# Patient Record
Sex: Male | Born: 1965 | Race: White | Hispanic: No | Marital: Single | Smoking: Current some day smoker
Health system: Southern US, Community
[De-identification: ages and names within clinical notes are randomized; demographics above are authoritative.]

## PROBLEM LIST (undated history)

## (undated) DIAGNOSIS — I10 Essential (primary) hypertension: Secondary | ICD-10-CM

## (undated) DIAGNOSIS — K2970 Gastritis, unspecified, without bleeding: Secondary | ICD-10-CM

## (undated) DIAGNOSIS — K859 Acute pancreatitis without necrosis or infection, unspecified: Secondary | ICD-10-CM

## (undated) DIAGNOSIS — B1920 Unspecified viral hepatitis C without hepatic coma: Secondary | ICD-10-CM

## (undated) HISTORY — PX: ANKLE SURGERY: SHX546

---

## 2016-09-14 DIAGNOSIS — D492 Neoplasm of unspecified behavior of bone, soft tissue, and skin: Secondary | ICD-10-CM | POA: Insufficient documentation

## 2016-09-14 DIAGNOSIS — L98492 Non-pressure chronic ulcer of skin of other sites with fat layer exposed: Secondary | ICD-10-CM | POA: Insufficient documentation

## 2016-12-08 DIAGNOSIS — F1210 Cannabis abuse, uncomplicated: Secondary | ICD-10-CM | POA: Insufficient documentation

## 2016-12-08 DIAGNOSIS — F1410 Cocaine abuse, uncomplicated: Secondary | ICD-10-CM | POA: Insufficient documentation

## 2017-07-03 DIAGNOSIS — K2920 Alcoholic gastritis without bleeding: Secondary | ICD-10-CM | POA: Insufficient documentation

## 2017-07-03 DIAGNOSIS — F602 Antisocial personality disorder: Secondary | ICD-10-CM | POA: Insufficient documentation

## 2017-07-03 DIAGNOSIS — I10 Essential (primary) hypertension: Secondary | ICD-10-CM | POA: Insufficient documentation

## 2017-10-18 DIAGNOSIS — F1910 Other psychoactive substance abuse, uncomplicated: Secondary | ICD-10-CM | POA: Insufficient documentation

## 2017-10-18 DIAGNOSIS — H7092 Unspecified mastoiditis, left ear: Secondary | ICD-10-CM | POA: Insufficient documentation

## 2018-10-18 DIAGNOSIS — F1020 Alcohol dependence, uncomplicated: Secondary | ICD-10-CM | POA: Insufficient documentation

## 2018-10-18 DIAGNOSIS — Z765 Malingerer [conscious simulation]: Secondary | ICD-10-CM | POA: Insufficient documentation

## 2018-11-11 ENCOUNTER — Emergency Department (HOSPITAL_COMMUNITY)
Admission: EM | Admit: 2018-11-11 | Discharge: 2018-11-11 | Disposition: A | Discharge status: Home / Self Care | Payer: Medicaid Other | Attending: Emergency Medicine | Admitting: Emergency Medicine

## 2018-11-11 ENCOUNTER — Encounter (HOSPITAL_COMMUNITY): Payer: Self-pay | Admitting: *Deleted

## 2018-11-11 ENCOUNTER — Other Ambulatory Visit: Payer: Self-pay

## 2018-11-11 ENCOUNTER — Emergency Department (HOSPITAL_COMMUNITY): Payer: Medicaid Other

## 2018-11-11 DIAGNOSIS — H9202 Otalgia, left ear: Secondary | ICD-10-CM | POA: Diagnosis present

## 2018-11-11 DIAGNOSIS — I10 Essential (primary) hypertension: Secondary | ICD-10-CM | POA: Diagnosis not present

## 2018-11-11 DIAGNOSIS — F17200 Nicotine dependence, unspecified, uncomplicated: Secondary | ICD-10-CM | POA: Diagnosis not present

## 2018-11-11 DIAGNOSIS — S0190XA Unspecified open wound of unspecified part of head, initial encounter: Secondary | ICD-10-CM

## 2018-11-11 HISTORY — DX: Essential (primary) hypertension: I10

## 2018-11-11 HISTORY — DX: Unspecified viral hepatitis C without hepatic coma: B19.20

## 2018-11-11 HISTORY — DX: Gastritis, unspecified, without bleeding: K29.70

## 2018-11-11 HISTORY — DX: Acute pancreatitis without necrosis or infection, unspecified: K85.90

## 2018-11-11 LAB — CBC WITH DIFFERENTIAL/PLATELET
Abs Immature Granulocytes: 0 10*3/uL (ref 0.00–0.07)
Basophils Absolute: 0 10*3/uL (ref 0.0–0.1)
Basophils Relative: 0 %
Eosinophils Absolute: 0.2 10*3/uL (ref 0.0–0.5)
Eosinophils Relative: 3 %
HCT: 37.3 % — ABNORMAL LOW (ref 39.0–52.0)
Hemoglobin: 12.9 g/dL — ABNORMAL LOW (ref 13.0–17.0)
Lymphocytes Relative: 51 %
Lymphs Abs: 2.9 10*3/uL (ref 0.7–4.0)
MCH: 34.8 pg — ABNORMAL HIGH (ref 26.0–34.0)
MCHC: 34.6 g/dL (ref 30.0–36.0)
MCV: 100.5 fL — ABNORMAL HIGH (ref 80.0–100.0)
Monocytes Absolute: 0.4 10*3/uL (ref 0.1–1.0)
Monocytes Relative: 7 %
Neutro Abs: 2.2 10*3/uL (ref 1.7–7.7)
Neutrophils Relative %: 39 %
Platelets: 146 10*3/uL — ABNORMAL LOW (ref 150–400)
RBC: 3.71 MIL/uL — ABNORMAL LOW (ref 4.22–5.81)
RDW: 12 % (ref 11.5–15.5)
WBC: 5.6 10*3/uL (ref 4.0–10.5)
nRBC: 0 % (ref 0.0–0.2)
nRBC: 0 /100 WBC

## 2018-11-11 LAB — BASIC METABOLIC PANEL
Anion gap: 12 (ref 5–15)
BUN: <5 mg/dL — ABNORMAL LOW (ref 6–20)
CO2: 25 mmol/L (ref 22–32)
Calcium: 8.9 mg/dL (ref 8.9–10.3)
Chloride: 99 mmol/L (ref 98–111)
Creatinine, Ser: 0.85 mg/dL (ref 0.61–1.24)
GFR calc Af Amer: >60 mL/min (ref >60)
GFR calc non Af Amer: >60 mL/min (ref >60)
Glucose, Bld: 120 mg/dL — ABNORMAL HIGH (ref 70–99)
Potassium: 3.4 mmol/L — ABNORMAL LOW (ref 3.5–5.1)
Sodium: 136 mmol/L (ref 135–145)

## 2018-11-11 MED ORDER — IBUPROFEN 400 MG PO TABS
400.0000 mg | ORAL_TABLET | Freq: Once | ORAL | Status: AC | PRN
  Administered 2018-11-11: 400 mg via ORAL
  Filled 2018-11-11: qty 1

## 2018-11-11 MED ORDER — IOHEXOL 300 MG/ML  SOLN
100.0000 mL | Freq: Once | INTRAMUSCULAR | Status: AC
  Administered 2018-11-11: 100 mL via INTRAVENOUS

## 2018-11-11 MED ORDER — DOXYCYCLINE HYCLATE 100 MG PO CAPS: 100.0000 mg | ORAL_CAPSULE | Freq: Two times a day (BID) | ORAL | 0 refills | Status: AC

## 2018-11-11 NOTE — ED Notes (Signed)
ED Provider at bedside. 

## 2018-11-11 NOTE — ED Notes (Signed)
Pt upset that he has been waiting and not seen a provider. Pt has been thus far calm and quiet in his room. He has become belligerent, yelling and cursing at staff. Out in hallways, yelling Security to bedside, provider at bedside for assessment.

## 2018-11-11 NOTE — ED Provider Notes (Signed)
[facility] Provider Note   CSN: [id number] Arrival date & time: 11/11/18  0310     History   Chief Complaint Chief Complaint  Patient presents with  . Alcohol Intoxication  . Wound Check    HPI Bryan Liu is a 53 y.o. male.  HPI Patient presents to the emergency department with a chronic wound around his left ear.  Patient is very combative and argumentative in the room.  He is not a very good historian.  He states that these areas around his left ear have been open for quite a while.  The patient states that been draining and feels like there is more blood coming from the area recently.  Patient states that he has been seen at [facility].  He states this started after having an area opened via his ear by an ENT doctor.  He states that was 8 years ago.  The patient states that he is not been on any recent antibiotics.  The patient denies chest pain, shortness of breath, headache,blurred vision, neck pain, fever, cough, weakness, numbness, dizziness, anorexia, edema, abdominal pain, nausea, vomiting, diarrhea, rash, back pain, dysuria, hematemesis, bloody stool, near syncope, or syncope. Past Medical History:  Diagnosis Date  . Gastritis   . Hepatitis C   . Hypertension   . Pancreatitis     Patient Active Problem List   Diagnosis Date Noted  . Malingerer 10/18/2018  . Uncomplicated alcohol dependence ([place]) 10/18/2018  . 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 Polysubstance abuse ([place]) 10/18/2017  . Acute alcoholic gastritis without hemorrhage 07/03/2017  . Antisocial personality disorder ([place]) 07/03/2017  . Essential hypertension 07/03/2017  . Cocaine abuse ([place]) 12/08/2016  . Marijuana abuse 12/08/2016  . Skin neoplasm 09/14/2016  . Skin ulcer with fat layer exposed ([place]) 09/14/2016    Past Surgical History:  Procedure Laterality Date  . ANKLE SURGERY          Home Medications    Prior to  Admission medications   Not on File    Family History No family history on file.  Social History Social History   Tobacco Use  . Smoking status: Current Some Day Smoker  Substance Use Topics  . Alcohol use: Yes  . Drug use: Yes    Types: Marijuana     Allergies   Bee venom   Review of Systems Review of Systems All other systems negative except as documented in the HPI. All pertinent positives and negatives as reviewed in the HPI.  Physical Exam Updated Vital Signs BP 114/75 (BP Location: Right Arm)   Pulse 84   Temp 97.8 F (36.6 C) (Oral)   Resp 14   Ht 5\' 9"  (1.753 m)   Wt 102.1 kg   SpO2 94%   BMI 33.23 kg/m   Physical Exam Vitals signs and nursing note reviewed.  Constitutional:      General: He is not in acute distress.    Appearance: He is well-developed.  HENT:     Head: Normocephalic and atraumatic.     Ears:   Eyes:     Pupils: Pupils are equal, round, and reactive to light.  Neck:     Musculoskeletal: Normal range of motion and neck supple.  Cardiovascular:     Rate and Rhythm: Normal rate and regular rhythm.     Heart sounds: Normal heart sounds. No murmur. No friction rub. No gallop.   Pulmonary:     Effort:  Pulmonary effort is normal. No respiratory distress.     Breath sounds: Normal breath sounds. No wheezing.  Skin:    General: Skin is warm and dry.     Capillary Refill: Capillary refill takes less than 2 seconds.     Findings: No erythema or rash.  Neurological:     Mental Status: He is alert and oriented to person, place, and time.     Motor: No abnormal muscle tone.     Coordination: Coordination normal.  Psychiatric:        Behavior: Behavior normal.      ED Treatments / Results  Labs (all labs ordered are listed, but only abnormal results are displayed) Labs Reviewed  BASIC METABOLIC PANEL - Abnormal; Notable for the following components:      Result Value   Potassium 3.4 (*)    Glucose, Bld 120 (*)    BUN <5 (*)      All other components within normal limits  CBC WITH DIFFERENTIAL/PLATELET - Abnormal; Notable for the following components:   RBC 3.71 (*)    Hemoglobin 12.9 (*)    HCT 37.3 (*)    MCV 100.5 (*)    MCH 34.8 (*)    Platelets 146 (*)    All other components within normal limits    EKG None  Radiology Ct Soft Tissue Neck W Contrast  Result Date: 11/11/2018 CLINICAL DATA:  Swelling and drainage in the left ear. Soft tissue wound. EXAM: CT NECK WITH CONTRAST TECHNIQUE: Multidetector CT imaging of the neck was performed using the standard protocol following the bolus administration of intravenous contrast. CONTRAST:  100 cc Omnipaque 300 COMPARISON:  Temporal bone same day FINDINGS: Pharynx and larynx: No mucosal or submucosal lesion. Salivary glands: Right parotid gland is normal. Both submandibular glands are normal. Abnormal appearance of the left parotid gland which shows abnormal tissue extending from the left temporal bone and auricle regions. This is nonspecific for inflammatory tissue versus tumor. Thyroid: Normal Lymph nodes: No enlarged or low-density lymph nodes on either side of the neck. Normal nodal station nodes. Vascular: No sign of venous thrombosis. Some extrinsic compression of the upper internal jugular vein on the left. Atherosclerotic calcification at both carotid bifurcations. Limited intracranial: Normal Visualized orbits: Normal Mastoids and visualized paranasal sinuses: Paranasal sinuses are clear. Right middle ear and mastoid region are normal. On the left, there is advanced mastoid pathology with bone destruction. See results of temporal bone study for detailed mastoid and middle ear findings. Pronounced abnormal soft tissue in the region of the auricle and regional soft tissues on the left, including extension into the region of the parotid gland, involvement of the upper sternocleidomastoid muscle and other upper neck posterolateral musculature. As noted above, this could  be inflammatory tissue, but the possibility of tumor is not excluded at this point. Skeleton: Ordinary cervical spondylosis. Skull base otherwise negative. See results of temporal bone scan. Upper chest: Negative Other: None IMPRESSION: Destructive process of the left temporal bone. Abnormal appearance of the auricle and soft tissues around the ear and in the upper left neck with the differential diagnosis being that of inflammatory disease versus tumor. No abnormality lower in the neck. See results of temporal bone CT. Electronically Signed   By: Nelson Chimes M.D.   On: 11/11/2018 11:07   Ct Temporal Bones W Contrast  Result Date: 11/11/2018 CLINICAL DATA:  Draining wound in the region of the left ear. EXAM: CT TEMPORAL BONES WITH CONTRAST TECHNIQUE: Axial  and coronal plane CT imaging of the petrous temporal bones was performed with thin-collimation image reconstruction after intravenous contrast administration. Multiplanar CT image reconstructions were also generated. CONTRAST:  100 cc Omnipaque 300 COMPARISON:  Neck CT same day FINDINGS: Right temporal bone appears normal. Mastoid air cells are clear. No fluid in the middle ear or attic. Inner ear structures are normal. Left temporal bone: Complete opacification of the mastoid air cells with areas of bone destruction, including along the lateral margin. Complete opacification of the middle ear and attic. Inner ear structures appear intact. Given the degree of bone destruction in the adjacent abnormal soft tissue in the region of the left ear and surrounding soft tissues, the differential diagnosis is that of advanced infection versus the possibility of malignancy. No evidence of intracranial complicating feature. Left transverse process, sigmoid sinus and jugular vein remain patent, though there is mild extrinsic compression of the upper internal jugular vein. IMPRESSION: Advanced destructive process of the left mastoid region and temporal bone with  left-sided soft tissue density in the region of the ear and surrounding soft tissues. The differential diagnosis is that of chronic destructive infection versus the possibility of malignancy. No sign of intracranial complication at this time. Electronically Signed   By: Nelson Chimes M.D.   On: 11/11/2018 11:12    Procedures Procedures (including critical care time)  Medications Ordered in ED Medications  ibuprofen (ADVIL,MOTRIN) tablet 400 mg (400 mg Oral Given 11/11/18 0615)  iohexol (OMNIPAQUE) 300 MG/ML solution 100 mL (100 mLs Intravenous Contrast Given 11/11/18 1037)     Initial Impression / Assessment and Plan / ED Course  I have reviewed the triage vital signs and the nursing notes.  Pertinent labs & imaging results that were available during my care of the patient were reviewed by me and considered in my medical decision making (see chart for details).     I spoke with the ear nose and throat who will evaluate the patient and help further work-up this area of possible destructive infection versus malignancy.  I advised the patient of the possibility of malignancy versus severe chronic infection.  The patient is advised he will need to call them for further evaluation and recheck.  Patient agrees to the plan and all questions were answered.  Dressing was placed around the area as well.  Final Clinical Impressions(s) / ED Diagnoses   Final diagnoses:  None    ED Discharge Orders    None       Dalia Heading, PA-C 11/11/18 1558    Noemi Chapel, MD 11/12/18 1731

## 2018-11-11 NOTE — ED Provider Notes (Signed)
Medical screening examination/treatment/procedure(s) were conducted as a shared visit with non-physician practitioner(s) and myself.  I personally evaluated the patient during the encounter.  Clinical Impression:   Final diagnoses:  Chronic wound of head    The patient is an unfortunate 53 year old male who has developed a chronic wound around his left ear involving the periauricular tissue the mastoid area and had been previously down onto his jaw though he states that the wound has significantly improved in size over time.  He does have some increasing pain that is extending onto the left mandibular area and on exam there is a slight redness to the cheek there.  It is unclear how much of this is chronic.  His wound itself in the posterior auricular area has a slight purulent discharge with a slight off putting smell.  The canal itself is swollen shut, he states this is chronic.  He has had prior CT scans showing some underlying abnormal tissue, given the patient's increasing pain will perform CT scan again to make sure there is no extension of underlying infection or possible tumor.  The patient does not appear toxic or ill-appearing and is able to walk talk and perform all of the tasks requested.  No fever or tachycardia   Noemi Chapel, MD 11/12/18 1731

## 2018-11-11 NOTE — Discharge Instructions (Signed)
You will need to see the ear nose and throat doctor provided.  Call their office to set up an appointment.  Tell them you are seen here in the ER.  Further testing needs to be performed on this wound because it shows that there could be the possibility of a chronic destructive infection versus a tumor in the region.Marland Kitchen

## 2018-11-11 NOTE — ED Triage Notes (Signed)
Pt here from somewhere with c/o a wound to the left ear and smells of etoh,

## 2018-11-11 NOTE — ED Notes (Signed)
Pt transported to CT ?

## 2019-07-18 IMAGING — CT CT TEMPORAL BONES W/ CM
2 of 11 series · 6 of 33 positions shown, 7 images · IV contrast (APPLIED)
Comparison: Neck CT same day

CLINICAL DATA: Draining wound in the region of the left ear.

EXAM:
CT TEMPORAL BONES WITH CONTRAST
TECHNIQUE: Axial and coronal plane CT imaging of the petrous temporal bones was
performed with thin-collimation image reconstruction after
intravenous contrast administration. Multiplanar CT image
reconstructions were also generated.
CONTRAST:  100 cc Omnipaque 300

[Series 9: neck 2.0 i31s 3 · axial · 0.53mm/px · z∈[-330,-28]mm · 3 of 152 slices shown, 4 images]
[im 1/152  soft-tissue]
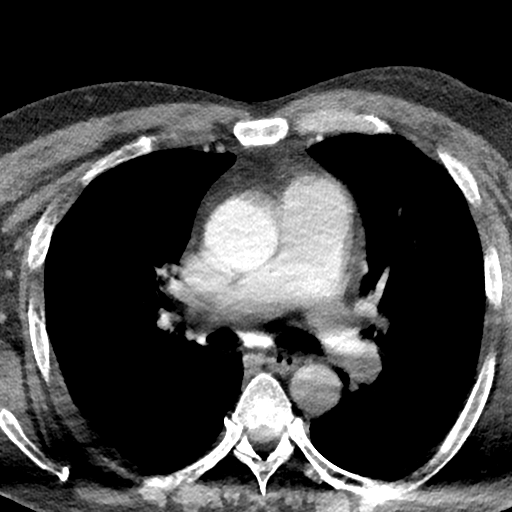
[im 1/152  bone]
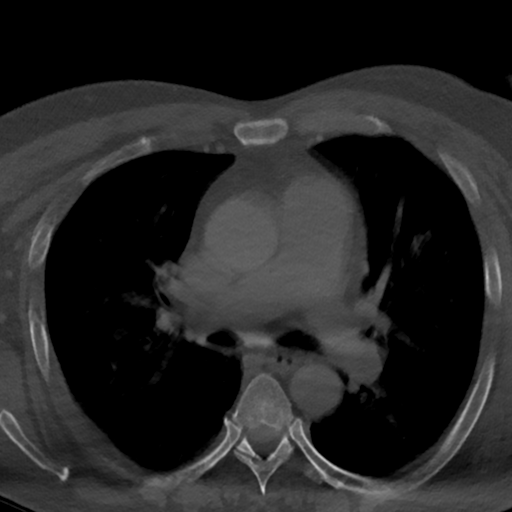
[im 76/152  bone]
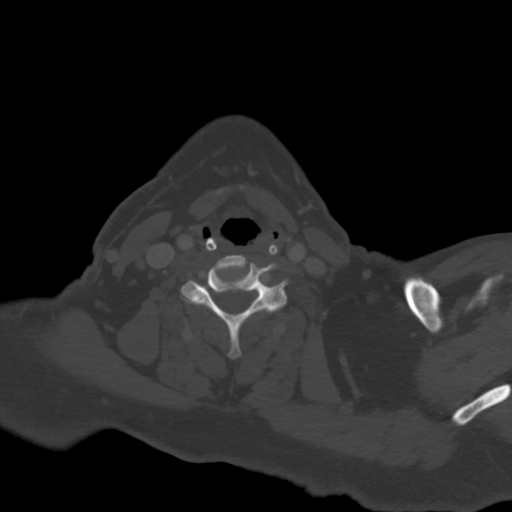
[im 152/152  bone]
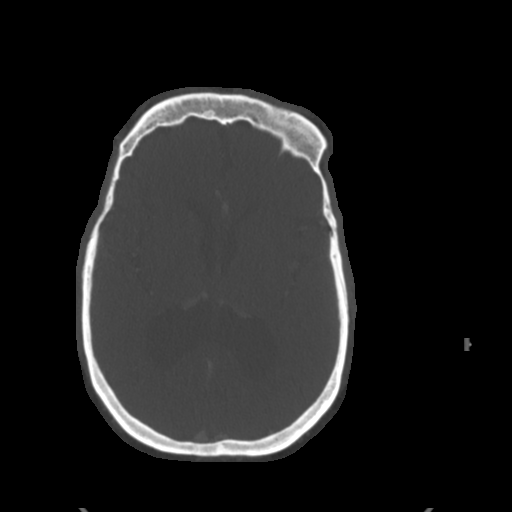

[Series 13: sagittal st · sagittal · 0.59mm/px · 3 of 101 slices shown]
[im 42/101  bone]
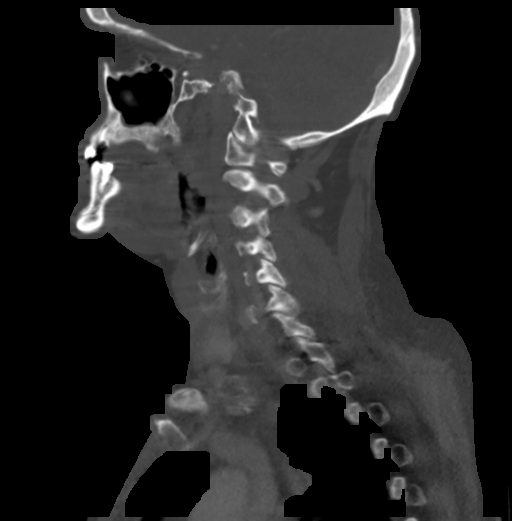
[im 51/101  bone]
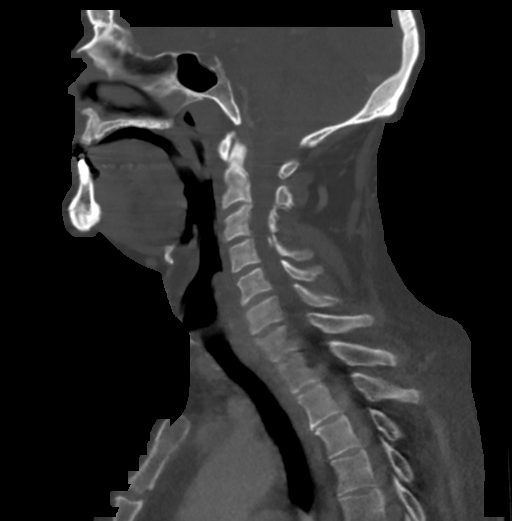
[im 59/101  bone]
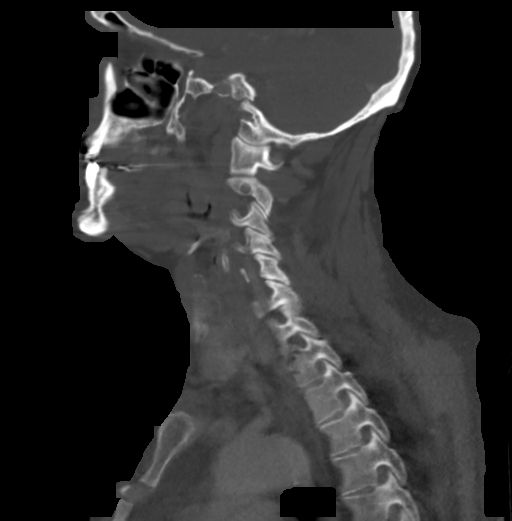

[6 of 33 positions shown; findings below may reference images not displayed]

FINDINGS: Right temporal bone appears normal. Mastoid air cells are clear. No
fluid in the middle ear or attic. Inner ear structures are normal.

Left temporal bone: Complete opacification of the mastoid air cells
with areas of bone destruction, including along the lateral margin.
Complete opacification of the middle ear and attic. Inner ear
structures appear intact. Given the degree of bone destruction in
the adjacent abnormal soft tissue in the region of the left ear and
surrounding soft tissues, the differential diagnosis is that of
advanced infection versus the possibility of malignancy.

No evidence of intracranial complicating feature. Left transverse
process, sigmoid sinus and jugular vein remain patent, though there
is mild extrinsic compression of the upper internal jugular vein.
IMPRESSION: Advanced destructive process of the left mastoid region and temporal
bone with left-sided soft tissue density in the region of the ear
and surrounding soft tissues. The differential diagnosis is that of
chronic destructive infection versus the possibility of malignancy.
No sign of intracranial complication at this time.

## 2019-11-27 DEATH — deceased
